# Patient Record
Sex: Male | Born: 1990 | Race: Black or African American | Hispanic: No | Marital: Single | Smoking: Light tobacco smoker
Health system: Southern US, Community
[De-identification: ages and names within clinical notes are randomized; demographics above are authoritative.]

## PROBLEM LIST (undated history)

## (undated) DIAGNOSIS — R55 Syncope and collapse: Secondary | ICD-10-CM

## (undated) HISTORY — DX: Syncope and collapse: R55

---
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
## 2011-03-12 LAB — I-STAT 8, (EC8 V) (CONVERTED LAB)
Acid-Base Excess: 2
BUN: 19
Bicarbonate: 29.4 — ABNORMAL HIGH
Chloride: 105
Glucose, Bld: 75
HCT: 53 — ABNORMAL HIGH
Hemoglobin: 18 — ABNORMAL HIGH
Operator id: 198171
Potassium: 4.4
Sodium: 140
TCO2: 31
pCO2, Ven: 55.3 — ABNORMAL HIGH
pH, Ven: 7.334 — ABNORMAL HIGH

## 2011-03-12 LAB — POCT I-STAT CREATININE: Operator id: 198171

## 2011-03-13 LAB — I-STAT 8, (EC8 V) (CONVERTED LAB)
Acid-Base Excess: 1
BUN: 12
Chloride: 105
HCT: 52 — ABNORMAL HIGH
Operator id: 288331
Potassium: 4.7
pCO2, Ven: 46.7
pH, Ven: 7.374 — ABNORMAL HIGH

## 2011-03-13 LAB — URINALYSIS, ROUTINE W REFLEX MICROSCOPIC
Ketones, ur: NEGATIVE
Nitrite: NEGATIVE
Protein, ur: NEGATIVE

## 2011-03-13 LAB — POCT I-STAT CREATININE: Creatinine, Ser: 1

## 2011-03-13 LAB — URINE MICROSCOPIC-ADD ON

## 2011-07-12 ENCOUNTER — Ambulatory Visit: Payer: Self-pay | Admitting: Internal Medicine

## 2011-07-25 ENCOUNTER — Ambulatory Visit: Payer: Self-pay | Admitting: Internal Medicine

## 2011-08-07 ENCOUNTER — Encounter: Payer: Self-pay | Admitting: Internal Medicine

## 2015-01-14 ENCOUNTER — Emergency Department (HOSPITAL_COMMUNITY)
Admission: EM | Admit: 2015-01-14 | Discharge: 2015-01-15 | Disposition: A | Discharge status: Home / Self Care | Payer: Self-pay | Attending: Emergency Medicine | Admitting: Emergency Medicine

## 2015-01-14 ENCOUNTER — Encounter (HOSPITAL_COMMUNITY): Payer: Self-pay

## 2015-01-14 ENCOUNTER — Emergency Department (HOSPITAL_COMMUNITY): Payer: Self-pay

## 2015-01-14 DIAGNOSIS — Z720 Tobacco use: Secondary | ICD-10-CM | POA: Insufficient documentation

## 2015-01-14 DIAGNOSIS — S01112A Laceration without foreign body of left eyelid and periocular area, initial encounter: Secondary | ICD-10-CM | POA: Insufficient documentation

## 2015-01-14 DIAGNOSIS — S022XXA Fracture of nasal bones, initial encounter for closed fracture: Secondary | ICD-10-CM | POA: Insufficient documentation

## 2015-01-14 DIAGNOSIS — Y9289 Other specified places as the place of occurrence of the external cause: Secondary | ICD-10-CM | POA: Insufficient documentation

## 2015-01-14 DIAGNOSIS — Y998 Other external cause status: Secondary | ICD-10-CM | POA: Insufficient documentation

## 2015-01-14 DIAGNOSIS — Y9389 Activity, other specified: Secondary | ICD-10-CM | POA: Insufficient documentation

## 2015-01-14 DIAGNOSIS — S023XXA Fracture of orbital floor, initial encounter for closed fracture: Secondary | ICD-10-CM | POA: Insufficient documentation

## 2015-01-14 DIAGNOSIS — S0231XA Fracture of orbital floor, right side, initial encounter for closed fracture: Secondary | ICD-10-CM

## 2015-01-14 MED ORDER — LIDOCAINE-EPINEPHRINE (PF) 2 %-1:200000 IJ SOLN
20.0000 mL | Freq: Once | INTRAMUSCULAR | Status: AC
  Administered 2015-01-14: 20 mL via INTRADERMAL
  Filled 2015-01-14: qty 20

## 2015-01-14 MED ORDER — AMOXICILLIN-POT CLAVULANATE 875-125 MG PO TABS
1.0000 | ORAL_TABLET | Freq: Once | ORAL | Status: AC
  Administered 2015-01-14: 1 via ORAL
  Filled 2015-01-14: qty 1

## 2015-01-14 MED ORDER — OXYCODONE-ACETAMINOPHEN 5-325 MG PO TABS
2.0000 | ORAL_TABLET | Freq: Once | ORAL | Status: AC
  Administered 2015-01-15: 2 via ORAL
  Filled 2015-01-14: qty 2
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
## 2015-01-14 NOTE — ED Notes (Signed)
Pt refused vitals 
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
## 2015-01-15 MED ORDER — OXYCODONE-ACETAMINOPHEN 5-325 MG PO TABS: 1.0000 | ORAL_TABLET | ORAL | Status: AC | PRN

## 2015-01-15 MED ORDER — AMOXICILLIN-POT CLAVULANATE 875-125 MG PO TABS
1.0000 | ORAL_TABLET | Freq: Two times a day (BID) | ORAL | Status: AC
Start: 2015-01-15 — End: ?

## 2015-01-15 NOTE — Discharge Instructions (Signed)
Take Augmentin as directed until gone. Take percocet as needed for pain. Follow up with Dr. Annalee Genta. DO NOT BLOW YOUR NOSE!!

## 2015-01-15 NOTE — ED Notes (Signed)
Pt stable, ambulatory, states understanding of discharge instructions 

## 2015-01-19 ENCOUNTER — Encounter (HOSPITAL_COMMUNITY): Payer: Self-pay | Admitting: *Deleted

## 2015-01-19 ENCOUNTER — Emergency Department (HOSPITAL_COMMUNITY)
Admission: EM | Admit: 2015-01-19 | Discharge: 2015-01-19 | Disposition: A | Discharge status: Home / Self Care | Payer: Self-pay | Attending: Emergency Medicine | Admitting: Emergency Medicine

## 2015-01-19 DIAGNOSIS — R51 Headache: Secondary | ICD-10-CM | POA: Insufficient documentation

## 2015-01-19 DIAGNOSIS — Z4802 Encounter for removal of sutures: Secondary | ICD-10-CM | POA: Insufficient documentation

## 2015-01-19 DIAGNOSIS — Z720 Tobacco use: Secondary | ICD-10-CM | POA: Insufficient documentation

## 2015-01-19 DIAGNOSIS — R200 Anesthesia of skin: Secondary | ICD-10-CM | POA: Insufficient documentation

## 2015-01-19 NOTE — ED Provider Notes (Signed)
CSN: [id number]     Arrival date & time 01/19/15  0418 History   First MD Initiated Contact with Patient 01/19/15 [phone]     Chief Complaint  Patient presents with  . Suture / Staple Removal     (Consider location/radiation/quality/duration/timing/severity/associated sxs/prior Treatment) HPI Comments: Pt comes in for with cc of suture removal. Suture placed few days ago. No active bleeding, no pus drainage.   Patient is a 24 y.o. male presenting with suture removal. The history is provided by the patient.  Suture / Staple Removal This is a new problem. The current episode started more than 2 days ago. The problem has not changed since onset.Associated symptoms include headaches.    Past Medical History  Diagnosis Date  . Syncope and collapse    History reviewed. No pertinent past surgical history. Family History  Problem Relation Age of Onset  . Heart murmur Father    Social History  Substance Use Topics  . Smoking status: Light Tobacco Smoker  . Smokeless tobacco: Never Used  . Alcohol Use: Yes    Review of Systems  Constitutional: Negative for activity change.  Skin: Positive for wound.  Neurological: Positive for numbness and headaches.  Hematological: Does not bruise/bleed easily.      Allergies  Review of patient's allergies indicates no known allergies.  Home Medications   Prior to Admission medications   Medication Sig Start Date End Date Taking? Authorizing Provider  amoxicillin-clavulanate (AUGMENTIN) 875-125 MG per tablet Take 1 tablet by mouth every 12 (twelve) hours. 01/15/15   Emilia Beck, PA-C  oxyCODONE-acetaminophen (PERCOCET/ROXICET) 5-325 MG per tablet Take 1-2 tablets by mouth every 4 (four) hours as needed for severe pain. 01/15/15   Kaitlyn Szekalski, PA-C   BP 128/65 mmHg  Pulse 64  Temp(Src) 97.7 F (36.5 C)  Resp 16  Ht  (1.88 m)  Wt 260 lb 2 oz (117.992 kg)  BMI 33.38 kg/m2  SpO2 98% Physical Exam  Constitutional: He is  oriented to person, place, and time. He appears well-developed.  HENT:  Head: Atraumatic.  Neck: Neck supple.  Cardiovascular: Normal rate.   Pulmonary/Chest: Effort normal.  Neurological: He is alert and oriented to person, place, and time.  Skin: Skin is warm.  Bilateral eye lid laceration - repaired.  Nursing note and vitals reviewed.   ED Course  Procedures (including critical care time) Labs Review Labs Reviewed - No data to display  Imaging Review No results found. I have personally reviewed and evaluated these images and lab results as part of my medical decision-making.   EKG Interpretation None      MDM   Final diagnoses:  Visit for suture removal    Pt with bilateral eye lid stitches. Stitches removed. Pt tolerated the procedure well. Wound is clean.  SUTURE REMOVAL Performed by: Derwood Kaplan  Consent: Verbal consent obtained. Patient identity confirmed: provided demographic data Time out: Immediately prior to procedure a "time out" was called to verify the correct patient, procedure, equipment, support staff and site/side marked as required.  Location details: bilateral eye lids  Wound Appearance: clean  Sutures/Staples Removed: 13  Facility: sutures placed in this facility Patient tolerance: Patient tolerated the procedure well with no immediate complications.     Derwood Kaplan, MD 01/19/15 [phone]

## 2015-01-19 NOTE — Discharge Instructions (Signed)
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
## 2015-01-19 NOTE — ED Notes (Signed)
Dr. Nanavanti at the bedside.  

## 2015-01-19 NOTE — ED Notes (Signed)
Dr. Rhunette Croft finished suture removal. Pt 2/10 pain and remains in position of comfort. Pt ready for discharge.

## 2015-09-19 ENCOUNTER — Encounter (HOSPITAL_COMMUNITY): Payer: Self-pay | Admitting: Emergency Medicine

## 2015-09-19 ENCOUNTER — Emergency Department (HOSPITAL_BASED_OUTPATIENT_CLINIC_OR_DEPARTMENT_OTHER): Admit: 2015-09-19 | Discharge: 2015-09-19 | Disposition: A | Discharge status: Home / Self Care | Payer: Self-pay

## 2015-09-19 ENCOUNTER — Ambulatory Visit (HOSPITAL_COMMUNITY)
Admission: EM | Admit: 2015-09-19 | Discharge: 2015-09-19 | Disposition: A | Discharge status: Nursing Home (Includes ICF) | Payer: Self-pay | Attending: Family Medicine | Admitting: Family Medicine

## 2015-09-19 ENCOUNTER — Encounter (HOSPITAL_COMMUNITY): Payer: Self-pay | Admitting: *Deleted

## 2015-09-19 ENCOUNTER — Emergency Department (HOSPITAL_COMMUNITY)
Admission: EM | Admit: 2015-09-19 | Discharge: 2015-09-19 | Disposition: A | Discharge status: Home / Self Care | Payer: Self-pay | Attending: Emergency Medicine | Admitting: Emergency Medicine

## 2015-09-19 DIAGNOSIS — M79609 Pain in unspecified limb: Secondary | ICD-10-CM

## 2015-09-19 DIAGNOSIS — M7989 Other specified soft tissue disorders: Secondary | ICD-10-CM

## 2015-09-19 DIAGNOSIS — I824Z2 Acute embolism and thrombosis of unspecified deep veins of left distal lower extremity: Secondary | ICD-10-CM | POA: Insufficient documentation

## 2015-09-19 DIAGNOSIS — I82402 Acute embolism and thrombosis of unspecified deep veins of left lower extremity: Secondary | ICD-10-CM

## 2015-09-19 DIAGNOSIS — F17200 Nicotine dependence, unspecified, uncomplicated: Secondary | ICD-10-CM | POA: Insufficient documentation

## 2015-09-19 MED ORDER — HYDROCODONE-ACETAMINOPHEN 5-325 MG PO TABS: 1.0000 | ORAL_TABLET | Freq: Four times a day (QID) | ORAL | Status: AC | PRN

## 2015-09-19 MED ORDER — RIVAROXABAN (XARELTO) VTE STARTER PACK (15 & 20 MG): 1.0000 | ORAL_TABLET | Freq: Every day | ORAL | Status: AC

## 2015-09-19 NOTE — ED Notes (Signed)
Pt  Reports  Pain  And  Swelling  Of  The  Left  Lower  Leg        Denys  Any injury  He  Reports  The  Symptoms  X   Several  Weeks      He   denys  Any  Chest  Pain     Or shortness  Of  Breath

## 2015-09-19 NOTE — Care Management (Signed)
ED CM consulted concerning medication assistance with Xarelto patient positve for DVT. Reviewed patient's record without PCP or health insurance. CM met with patient to discuss medication assistance with Xarelto. Provided patient with 30 day free trial card and instruction on how to redeem.  and also discussed the importance of follow-up, suggested  the [facility] for follow-up patient is agreeable. Appt scheduled for Thursday 4/27 at 11:30am with Dr. Jarold Song.  Teach back was done patient verbalized understanding, information placed on AVS, also provided a [facility] brochure. No further questions or concerns verbalized. No further CM needs identified.

## 2015-09-19 NOTE — ED Provider Notes (Signed)
CSN: [id number]     Arrival date & time 09/19/15  1426 History  By signing my name below, I, Tanda Rockers, attest that this documentation has been prepared under the direction and in the presence of [employer], PA-C.  Electronically Signed: Tanda Rockers, ED Scribe. 09/19/2015. 3:45 PM.   Chief Complaint  Patient presents with  . Leg Swelling   The history is provided by the patient. No language interpreter was used.     HPI Comments: AADARSH COZORT is a 25 y.o. male who presents to the Emergency Department complaining of gradual onset, intermittent, cramping, left calf pain x 2 weeks, constant for the past 4 days. Pt reports that his pain subsided for 2 days and then returned. No known injury or trauma to the leg. When the pain returned he noticed swelling to the left lower leg. Pt's pain is exacerbated with straightening of the leg and with walking. He has not taken anything for the pain. Pt was seen at [facility] this morning and sent here to rule out DVT. No Hx or FHx DVT/PE. No recent prolonged travel or immobilization. Denies chest pain, shortness of breath, color change, or any other associated symptoms.    Past Medical History  Diagnosis Date  . Syncope and collapse    History reviewed. No pertinent past surgical history. Family History  Problem Relation Age of Onset  . Heart murmur Father    Social History  Substance Use Topics  . Smoking status: Light Tobacco Smoker  . Smokeless tobacco: Never Used  . Alcohol Use: Yes    Review of Systems  Respiratory: Negative for shortness of breath.   Cardiovascular: Positive for leg swelling (LLE). Negative for chest pain.  Musculoskeletal: Positive for arthralgias (left calf).  Skin: Negative for color change.  All other systems reviewed and are negative.  Allergies  Review of patient's allergies indicates no known allergies.  Home Medications   Prior to Admission medications   Medication Sig Start Date End Date Taking?  Authorizing Provider  amoxicillin-clavulanate (AUGMENTIN) 875-125 MG per tablet Take 1 tablet by mouth every 12 (twelve) hours. 01/15/15   Emilia Beck, PA-C  oxyCODONE-acetaminophen (PERCOCET/ROXICET) 5-325 MG per tablet Take 1-2 tablets by mouth every 4 (four) hours as needed for severe pain. 01/15/15   Kaitlyn Szekalski, PA-C   BP 137/88 mmHg  Pulse 52  Temp(Src) 98.5 F (36.9 C) (Oral)  Resp 18  SpO2 95%   Physical Exam  Constitutional: He is oriented to person, place, and time. He appears well-developed and well-nourished. No distress.  HENT:  Head: Normocephalic and atraumatic.  Eyes: Conjunctivae and EOM are normal.  Neck: Neck supple. No tracheal deviation present.  Cardiovascular: Normal rate, regular rhythm, normal heart sounds and intact distal pulses.   Pulmonary/Chest: Effort normal and breath sounds normal. No respiratory distress. He has no wheezes. He has no rales.  Musculoskeletal: Normal range of motion. He exhibits edema and tenderness.  2+ DP pulse.  TTP of the left calf. Diffuse swelling of the left lower leg. No erythema or warmth.  Distal sensation in left foot is intact.  Full ROM of left knee and left ankle.   Neurological: He is alert and oriented to person, place, and time.  Skin: Skin is warm and dry.  Psychiatric: He has a normal mood and affect. His behavior is normal.  Nursing note and vitals reviewed.   ED Course  Procedures (including critical care time)  DIAGNOSTIC STUDIES: Oxygen Saturation is 95% on RA,  adequate by my interpretation.    COORDINATION OF CARE: 3:43 PM-Discussed treatment plan which includes US Lower extremity with pt at bedside and pt agreed to plan.   4:15 PM - Discussed case with attending physician, Dr. Jeraldine LootsLockwood, who suggests consult to vascular.   4:20 PM - Paged vascular.   4:52 PM - Discussed case with vascular surgeon, Dr. Edilia Boickson, who recommends consultation to interventional radiology.   4:53 PM - Paged IR.    5:09 PM - Spoke with  Dr. Miles CostainShick with IR who recommended putting pt on Xarelto and follow up in office if no improvement after 1 week.   Labs Review Labs Reviewed - No data to display  Imaging Review No results found. I have personally reviewed and evaluated these images as part of my medical decision-making.   EKG Interpretation None      MDM   Final diagnoses:  None  Patient presents today with LLE pain and swelling.  Doppler ultrasound positive for DVT of the left mid femoral vein, popliteal vein, posterior tibial, and peroneal veins.  Consulted IR who recommended starting patient on Xarelto and follow up outpatient if no improvement after one week.  Case Management consulted and set the patient up with the Xarelto.  Patient denies CP or SOB.  He is ambulating without difficulty.  Neurovascularly intact.  Stable for discharge.  Return precautions given.   I personally performed the services described in this documentation, which was scribed in my presence. The recorded information has been reviewed and is accurate.      Santiago GladHeather Atreyu Mak, PA-C 09/19/15 1856  Gerhard Munchobert Lockwood, MD 09/19/15 2329

## 2015-09-19 NOTE — ED Notes (Signed)
Patient transported to X-ray 

## 2015-09-19 NOTE — Discharge Instructions (Signed)
Deep Vein Thrombosis °A deep vein thrombosis (DVT) is a blood clot (thrombus) that usually occurs in a deep, larger vein of the lower leg or the pelvis, or in an upper extremity such as the arm. These are dangerous and can lead to serious and even life-threatening complications if the clot travels to the lungs. °A DVT can damage the valves in your leg veins so that instead of flowing upward, the blood pools in the lower leg. This is called post-thrombotic syndrome, and it can result in pain, swelling, discoloration, and sores on the leg. °CAUSES °A DVT is caused by the formation of a blood clot in your leg, pelvis, or arm. Usually, several things contribute to the formation of blood clots. A clot may develop when: °· Your blood flow slows down. °· Your vein becomes damaged in some way. °· You have a condition that makes your blood clot more easily. °RISK FACTORS °A DVT is more likely to develop in: °· People who are older, especially over 60 years of age. °· People who are overweight (obese). °· People who sit or lie still for a long time, such as during long-distance travel (over 4 hours), bed rest, hospitalization, or during recovery from certain medical conditions like a stroke. °· People who do not engage in much physical activity (sedentary lifestyle). °· People who have chronic breathing disorders. °· People who have a personal or family history of blood clots or blood clotting disease. °· People who have peripheral vascular disease (PVD), diabetes, or some types of cancer. °· People who have heart disease, especially if the person had a recent heart attack or has congestive heart failure. °· People who have neurological diseases that affect the legs (leg paresis). °· People who have had a traumatic injury, such as breaking a hip or leg. °· People who have recently had major or lengthy surgery, especially on the hip, knee, or abdomen. °· People who have had a central line placed inside a large vein. °· People  who take medicines that contain the hormone estrogen. These include birth control pills and hormone replacement therapy. °· Pregnancy or during childbirth or the postpartum period. °· Long plane flights (over 8 hours). °SIGNS AND SYMPTOMS °Symptoms of a DVT can include:  °· Swelling of your leg or arm, especially if one side is much worse. °· Warmth and redness of your leg or arm, especially if one side is much worse. °· Pain in your arm or leg. If the clot is in your leg, symptoms may be more noticeable or worse when you stand or walk. °· A feeling of pins and needles, if the clot is in the arm. °The symptoms of a DVT that has traveled to the lungs (pulmonary embolism, PE) usually start suddenly and include: °· Shortness of breath while active or at rest. °· Coughing or coughing up blood or blood-tinged mucus. °· Chest pain that is often worse with deep breaths. °· Rapid or irregular heartbeat. °· Feeling light-headed or dizzy. °· Fainting. °· Feeling anxious. °· Sweating. °There may also be pain and swelling in a leg if that is where the blood clot started. °These symptoms may represent a serious problem that is an emergency. Do not wait to see if the symptoms will go away. Get medical help right away. Call your local emergency services (911 in the [place] Do not drive yourself to the hospital. °DIAGNOSIS °Your health care provider will take a medical history and perform a physical exam. You may also   have other tests, including: °· Blood tests to assess the clotting properties of your blood. °· Imaging tests, such as CT, ultrasound, MRI, X-ray, and other tests to see if you have clots anywhere in your body. °TREATMENT °After a DVT is identified, it can be treated. The type of treatment that you receive depends on many factors, such as the cause of your DVT, your risk for bleeding or developing more clots, and other medical conditions that you have. Sometimes, a combination of treatments is necessary. Treatment  options may be combined and include: °· Monitoring the blood clot with ultrasound. °· Taking medicines by mouth, such as newer blood thinners (anticoagulants), thrombolytics, or warfarin. °· Taking anticoagulant medicine by injection or through an IV tube. °· Wearing compression stockings or using different types of devices. °· Surgery (rare) to remove the blood clot or to place a filter in your abdomen to stop the blood clot from traveling to your lungs. °Treatments for a DVT are often divided into immediate treatment and long-term treatment (up to 3 months after DVT). You can work with your health care provider to choose the treatment program that is best for you. °HOME CARE INSTRUCTIONS °If you are taking a newer oral anticoagulant: °· Take the medicine every single day at the same time each day. °· Understand what foods and drugs interact with this medicine. °· Understand that there are no regular blood tests required when using this medicine. °· Understand the side effects of this medicine, including excessive bruising or bleeding. Ask your health care provider or pharmacist about other possible side effects. °If you are taking warfarin: °· Understand how to take warfarin and know which foods can affect how warfarin works in your body. °· Understand that it is dangerous to take too much or too little warfarin. Too much warfarin increases the risk of bleeding. Too little warfarin continues to allow the risk for blood clots. °· Follow your PT and INR blood testing schedule. The PT and INR results allow your health care provider to adjust your dose of warfarin. It is very important that you have your PT and INR tested as often as told by your health care provider. °· Avoid major changes in your diet, or tell your health care provider before you change your diet. Arrange a visit with a registered dietitian to answer your questions. Many foods, especially foods that are high in vitamin K, can interfere with warfarin  and affect the PT and INR results. Eat a consistent amount of foods that are high in vitamin K, such as: °¨ Spinach, kale, broccoli, cabbage, collard greens, turnip greens, Brussels sprouts, peas, cauliflower, seaweed, and parsley. °¨ Beef liver and pork liver. °¨ Green tea. °¨ Soybean oil. °· Tell your health care provider about any and all medicines, vitamins, and supplements that you take, including aspirin and other over-the-counter anti-inflammatory medicines. Be especially cautious with aspirin and anti-inflammatory medicines. Do not take those before you ask your health care provider if it is safe to do so. This is important because many medicines can interfere with warfarin and affect the PT and INR results. °· Do not start or stop taking any over-the-counter or prescription medicine unless your health care provider or pharmacist tells you to do so. °If you take warfarin, you will also need to do these things: °· Hold pressure over cuts for longer than usual. °· Tell your dentist and other health care providers that you are taking warfarin before you have any procedures in which   bleeding may occur. °· Avoid alcohol or drink very small amounts. Tell your health care provider if you change your alcohol intake. °· Do not use tobacco products, including cigarettes, chewing tobacco, and e-cigarettes. If you need help quitting, ask your health care provider. °· Avoid contact sports. °General Instructions °· Take over-the-counter and prescription medicines only as told by your health care provider. Anticoagulant medicines can have side effects, including easy bruising and difficulty stopping bleeding. If you are prescribed an anticoagulant, you will also need to do these things: °¨ Hold pressure over cuts for longer than usual. °¨ Tell your dentist and other health care providers that you are taking anticoagulants before you have any procedures in which bleeding may occur. °¨ Avoid contact sports. °· Wear a medical  alert bracelet or carry a medical alert card that says you have had a PE. °· Ask your health care provider how soon you can go back to your normal activities. Stay active to prevent new blood clots from forming. °· Make sure to exercise while traveling or when you have been sitting or standing for a long period of time. It is very important to exercise. Exercise your legs by walking or by tightening and relaxing your leg muscles often. Take frequent walks. °· Wear compression stockings as told by your health care provider to help prevent more blood clots from forming. °· Do not use tobacco products, including cigarettes, chewing tobacco, and e-cigarettes. If you need help quitting, ask your health care provider. °· Keep all follow-up appointments with your health care provider. This is important. °PREVENTION °Take these actions to decrease your risk of developing another DVT: °· Exercise regularly. For at least 30 minutes every day, engage in: °¨ Activity that involves moving your arms and legs. °¨ Activity that encourages good blood flow through your body by increasing your heart rate. °· Exercise your arms and legs every hour during long-distance travel (over 4 hours). Drink plenty of water and avoid drinking alcohol while traveling. °· Avoid sitting or lying in bed for long periods of time without moving your legs. °· Maintain a weight that is appropriate for your height. Ask your health care provider what weight is healthy for you. °· If you are a woman who is over 35 years of age, avoid unnecessary use of medicines that contain estrogen. These include birth control pills. °· Do not smoke, especially if you take estrogen medicines. If you need help quitting, ask your health care provider. °If you are hospitalized, prevention measures may include: °· Early walking after surgery, as soon as your health care provider says that it is safe. °· Receiving anticoagulants to prevent blood clots. If you cannot take  anticoagulants, other options may be available, such as wearing compression stockings or using different types of devices. °SEEK IMMEDIATE MEDICAL CARE IF: °· You have new or increased pain, swelling, or redness in an arm or leg. °· You have numbness or tingling in an arm or leg. °· You have shortness of breath while active or at rest. °· You have chest pain. °· You have a rapid or irregular heartbeat. °· You feel light-headed or dizzy. °· You cough up blood. °· You notice blood in your vomit, bowel movement, or urine. °These symptoms may represent a serious problem that is an emergency. Do not wait to see if the symptoms will go away. Get medical help right away. Call your local emergency services (911 in the [place] Do not drive yourself to the hospital. °  °  This information is not intended to replace advice given to you by your health care provider. Make sure you discuss any questions you have with your health care provider. °  °Document Released: 05/20/2005 Document Revised: 02/08/2015 Document Reviewed: 09/14/2014 °Elsevier Interactive Patient Education ©2016 [employer] ° °

## 2015-09-19 NOTE — ED Notes (Signed)
[facility] swelling and R/O blood clot

## 2015-09-19 NOTE — ED Notes (Signed)
Sent from Urgent Care for follow up on possible DVT.

## 2015-09-19 NOTE — Progress Notes (Signed)
Preliminary results by tech - Venous Duplex Lower Ext. Completed. Positive for acute deep vein thrombosis involving the left mid femoral vein, popliteal vein, posterior tibial and peroneal veins. Right lower ext., negative for DVT or SVT. Results given to Clydie BraunKaren, patient's nurse.  Marilynne Halstedita Tyaire Odem, BS, RDMS, RVT

## 2015-09-19 NOTE — ED Provider Notes (Signed)
CSN: [id number]     Arrival date & time 09/19/15  1257 History   First MD Initiated Contact with Patient 09/19/15 1316     Chief Complaint  Patient presents with  . Leg Swelling   (Consider location/radiation/quality/duration/timing/severity/associated sxs/prior Treatment) Patient is a 25 y.o. male presenting with leg pain. The history is provided by the patient.  Leg Pain Location:  Leg Time since incident:  2 weeks Injury: no   Leg location:  L lower leg Pain details:    Quality:  Cramping (pain in lower leg sl resolved for 2 days then relapsed 4 d ago with assoc leg swelling.constant.)   Radiates to:  Does not radiate   Severity:  Moderate   Onset quality:  Sudden   Progression:  Worsening Chronicity:  New   Past Medical History  Diagnosis Date  . Syncope and collapse    History reviewed. No pertinent past surgical history. Family History  Problem Relation Age of Onset  . Heart murmur Father    Social History  Substance Use Topics  . Smoking status: Light Tobacco Smoker  . Smokeless tobacco: Never Used  . Alcohol Use: Yes    Review of Systems  Constitutional: Negative.   Respiratory: Negative.   Cardiovascular: Positive for leg swelling. Negative for palpitations.  Musculoskeletal: Negative for joint swelling.  Skin: Negative.   All other systems reviewed and are negative.   Allergies  Review of patient's allergies indicates no known allergies.  Home Medications   Prior to Admission medications   Medication Sig Start Date End Date Taking? Authorizing Provider  amoxicillin-clavulanate (AUGMENTIN) 875-125 MG per tablet Take 1 tablet by mouth every 12 (twelve) hours. 01/15/15   Emilia BeckKaitlyn Szekalski, PA-C  oxyCODONE-acetaminophen (PERCOCET/ROXICET) 5-325 MG per tablet Take 1-2 tablets by mouth every 4 (four) hours as needed for severe pain. 01/15/15   Emilia BeckKaitlyn Szekalski, PA-C   Meds Ordered and Administered this Visit  Medications - No data to display  BP 134/81  mmHg  Pulse 70  Temp(Src) 98.8 F (37.1 C) (Oral)  Resp 18  SpO2 97% No data found.   Physical Exam  Constitutional: He is oriented to person, place, and time. He appears well-developed and well-nourished. No distress.  Cardiovascular: Normal rate, regular rhythm and normal heart sounds.   Musculoskeletal: He exhibits edema and tenderness.       Left lower leg: He exhibits tenderness, swelling and edema.       Legs: Neurological: He is alert and oriented to person, place, and time.  Skin: Skin is warm and dry.  Nursing note and vitals reviewed.   ED Course  Procedures (including critical care time)  Labs Review Labs Reviewed - No data to display  Imaging Review No results found.   Visual Acuity Review  Right Eye Distance:   Left Eye Distance:   Bilateral Distance:    Right Eye Near:   Left Eye Near:    Bilateral Near:         MDM   1. DVT (deep venous thrombosis), left    Sent to r/o left leg dvt., worsening sx for 2 weeks.    Linna HoffJames D Kindl, MD 09/19/15 1350

## 2015-09-28 ENCOUNTER — Inpatient Hospital Stay: Payer: Self-pay | Admitting: Family Medicine
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
